# Patient Record
Sex: Female | Born: 1970 | Race: White | Hispanic: No | Marital: Married | Smoking: Never smoker
Health system: Southern US, Community
[De-identification: ages and names within clinical notes are randomized; demographics above are authoritative.]

## PROBLEM LIST (undated history)

## (undated) DIAGNOSIS — J45909 Unspecified asthma, uncomplicated: Secondary | ICD-10-CM

---

## 2014-01-21 ENCOUNTER — Emergency Department (HOSPITAL_BASED_OUTPATIENT_CLINIC_OR_DEPARTMENT_OTHER)
Admission: EM | Admit: 2014-01-21 | Discharge: 2014-01-21 | Disposition: A | Discharge status: Home / Self Care | Payer: 59 | Attending: Emergency Medicine | Admitting: Emergency Medicine

## 2014-01-21 ENCOUNTER — Other Ambulatory Visit: Payer: Self-pay

## 2014-01-21 ENCOUNTER — Emergency Department (HOSPITAL_BASED_OUTPATIENT_CLINIC_OR_DEPARTMENT_OTHER): Payer: 59

## 2014-01-21 ENCOUNTER — Encounter (HOSPITAL_BASED_OUTPATIENT_CLINIC_OR_DEPARTMENT_OTHER): Payer: Self-pay | Admitting: Emergency Medicine

## 2014-01-21 DIAGNOSIS — Z79899 Other long term (current) drug therapy: Secondary | ICD-10-CM | POA: Insufficient documentation

## 2014-01-21 DIAGNOSIS — J45901 Unspecified asthma with (acute) exacerbation: Secondary | ICD-10-CM | POA: Insufficient documentation

## 2014-01-21 DIAGNOSIS — F411 Generalized anxiety disorder: Secondary | ICD-10-CM | POA: Insufficient documentation

## 2014-01-21 DIAGNOSIS — F419 Anxiety disorder, unspecified: Secondary | ICD-10-CM

## 2014-01-21 DIAGNOSIS — R0600 Dyspnea, unspecified: Secondary | ICD-10-CM

## 2014-01-21 HISTORY — DX: Unspecified asthma, uncomplicated: J45.909

## 2014-01-21 LAB — CBC WITH DIFFERENTIAL/PLATELET
BASOS ABS: 0 10*3/uL (ref 0.0–0.1)
Basophils Relative: 1 % (ref 0–1)
Eosinophils Absolute: 0.1 10*3/uL (ref 0.0–0.7)
Eosinophils Relative: 1 % (ref 0–5)
HEMATOCRIT: 38.7 % (ref 36.0–46.0)
Hemoglobin: 12.8 g/dL (ref 12.0–15.0)
LYMPHS PCT: 34 % (ref 12–46)
Lymphs Abs: 2.5 10*3/uL (ref 0.7–4.0)
MCH: 26.3 pg (ref 26.0–34.0)
MCHC: 33.1 g/dL (ref 30.0–36.0)
MCV: 79.5 fL (ref 78.0–100.0)
MONO ABS: 0.4 10*3/uL (ref 0.1–1.0)
Monocytes Relative: 6 % (ref 3–12)
NEUTROS ABS: 4.3 10*3/uL (ref 1.7–7.7)
Neutrophils Relative %: 59 % (ref 43–77)
Platelets: 550 10*3/uL — ABNORMAL HIGH (ref 150–400)
RBC: 4.87 MIL/uL (ref 3.87–5.11)
RDW: 13.9 % (ref 11.5–15.5)
WBC: 7.4 10*3/uL (ref 4.0–10.5)

## 2014-01-21 LAB — COMPREHENSIVE METABOLIC PANEL
ALT: 17 U/L (ref 0–35)
AST: 16 U/L (ref 0–37)
Albumin: 4.1 g/dL (ref 3.5–5.2)
Alkaline Phosphatase: 75 U/L (ref 39–117)
BUN: 11 mg/dL (ref 6–23)
CHLORIDE: 105 meq/L (ref 96–112)
CO2: 21 mEq/L (ref 19–32)
Calcium: 9.7 mg/dL (ref 8.4–10.5)
Creatinine, Ser: 0.7 mg/dL (ref 0.50–1.10)
GFR calc Af Amer: >90 mL/min (ref >90)
GFR calc non Af Amer: >90 mL/min (ref >90)
Glucose, Bld: 106 mg/dL — ABNORMAL HIGH (ref 70–99)
Potassium: 3.7 mEq/L (ref 3.7–5.3)
Sodium: 142 mEq/L (ref 137–147)
Total Bilirubin: <0.2 mg/dL — ABNORMAL LOW (ref 0.3–1.2)
Total Protein: 7.5 g/dL (ref 6.0–8.3)

## 2014-01-21 LAB — TROPONIN I: Troponin I: <0.3 ng/mL (ref <0.30)

## 2014-01-21 LAB — D-DIMER, QUANTITATIVE: D-Dimer, Quant: <0.27 ug/mL-FEU (ref 0.00–0.48)

## 2014-01-21 MED ORDER — ALBUTEROL SULFATE (2.5 MG/3ML) 0.083% IN NEBU
5.0000 mg | INHALATION_SOLUTION | Freq: Once | RESPIRATORY_TRACT | Status: AC
  Administered 2014-01-21: 5 mg via RESPIRATORY_TRACT
  Filled 2014-01-21: qty 6

## 2014-01-21 MED ORDER — ALBUTEROL SULFATE HFA 108 (90 BASE) MCG/ACT IN AERS: 1.0000 | INHALATION_SPRAY | Freq: Four times a day (QID) | RESPIRATORY_TRACT | Status: DC | PRN

## 2014-01-21 NOTE — ED Notes (Signed)
Pt to room 3 by ems via stretcher. Pt reports sudden onset of sob while sitting at her desk at work, pt reports ongoing "tickling, dry" cough also. Pt states she did not have the cough before the sudden onset of sob.

## 2014-01-21 NOTE — ED Provider Notes (Signed)
CSN: [id number]     Arrival date & time 01/21/14  1208 History   First MD Initiated Contact with Patient 01/21/14 1208     Chief Complaint  Patient presents with  . Shortness of Breath     (Consider location/radiation/quality/duration/timing/severity/associated sxs/prior Treatment) HPI Comments: Patient presents with sudden onset of shortness of breath while she was sitting at her desk talking on the phone. Associated with dry cough. She denies any chest pain, fever or chills. She's had cold symptoms for the past several days but thought she was getting better. No fever. He does not smoke. She states she has a history of asthma but only uses an inhaler occasionally when she has allergies. She is not taking medications everyday. No leg pain or leg swelling. no birth control use. No recent travel. History of anxiety attacks remotely.  The history is provided by the patient.    Past Medical History  Diagnosis Date  . Asthma    History reviewed. No pertinent past surgical history. History reviewed. No pertinent family history. History  Substance Use Topics  . Smoking status: Never Smoker   . Smokeless tobacco: Not on file  . Alcohol Use: Not on file   OB History   Grav Para Term Preterm Abortions TAB SAB Ect Mult Living                 Review of Systems  Constitutional: Positive for activity change. Negative for appetite change.  HENT: Negative for congestion and rhinorrhea.   Respiratory: Positive for cough and shortness of breath. Negative for chest tightness.   Cardiovascular: Negative for chest pain.  Gastrointestinal: Negative for nausea, vomiting and abdominal pain.  Genitourinary: Negative for dysuria and hematuria.  Musculoskeletal: Negative for arthralgias, back pain and myalgias.  Skin: Negative for rash.  Neurological: Negative for dizziness, weakness and headaches.  A complete 10 system review of systems was obtained and all systems are negative except as noted in the  HPI and PMH.      Allergies  Review of patient's allergies indicates no known allergies.  Home Medications   Current Outpatient Rx  Name  Route  Sig  Dispense  Refill  . albuterol (PROVENTIL HFA;VENTOLIN HFA) 108 (90 BASE) MCG/ACT inhaler   Inhalation   Inhale 1-2 puffs into the lungs every 6 (six) hours as needed for wheezing or shortness of breath.   1 Inhaler   0    BP 133/78  Pulse 100  Resp 16  SpO2 100%  LMP 01/14/2014 Physical Exam  Constitutional: She is oriented to person, place, and time. She appears well-developed and well-nourished. No distress.  anxious  HENT:  Head: Normocephalic and atraumatic.  Mouth/Throat: Oropharynx is clear and moist. No oropharyngeal exudate.  Eyes: Conjunctivae and EOM are normal. Pupils are equal, round, and reactive to light.  Neck: Normal range of motion. Neck supple.  Cardiovascular: Normal rate, regular rhythm and normal heart sounds.   No murmur heard. Pulmonary/Chest: Effort normal and breath sounds normal. No respiratory distress. She has no wheezes.  Abdominal: Soft. There is no tenderness. There is no rebound and no guarding.  Musculoskeletal: Normal range of motion. She exhibits no edema and no tenderness.  Neurological: She is alert and oriented to person, place, and time. No cranial nerve deficit. She exhibits normal muscle tone. Coordination normal.  Skin: Skin is warm.    ED Course  Procedures (including critical care time) Labs Review Labs Reviewed  CBC WITH DIFFERENTIAL - Abnormal; Notable for  the following:    Platelets 550 (*)    All other components within normal limits  COMPREHENSIVE METABOLIC PANEL - Abnormal; Notable for the following:    Glucose, Bld 106 (*)    Total Bilirubin <0.2 (*)    All other components within normal limits  TROPONIN I  D-DIMER, QUANTITATIVE   Imaging Review Dg Chest 2 View  01/21/2014   CLINICAL DATA:  Shortness of breath.  EXAM: CHEST  2 VIEW  COMPARISON:  None.  FINDINGS:  The heart size and mediastinal contours are within normal limits. Both lungs are clear. The visualized skeletal structures are unremarkable.  IMPRESSION: No active cardiopulmonary disease.   Electronically Signed   By: Roque LiasJames  Green M.D.   On: 01/21/2014 13:24     EKG Interpretation None      MDM   Final diagnoses:  Dyspnea  Anxiety    Sudden onset of shortness of breath while sitting at work. Associated with dry cough. No chest pain. Patient is in no distress. Lungs are clear. Oxygenation is normal. EKG normal sinus rhythm.  Check chest x-ray with labs and d-dimer.  EKG normal sinus rhythm. D-dimer negative. Troponin negative.  Patient improved on recheck. X-ray negative. No wheezing on exam.  Suspect acute dyspnea likely secondary to anxiety with possibly some component of bronchospasm. No evidence of MI, PE, pneumonia pneumothorax. 02 saturation 100%. No wheezing. Patient feels better. We'll discharge with albuterol inhaler and PCP followup.   Date: 01/21/2014  Rate: 70  Rhythm: normal sinus rhythm  QRS Axis: normal  Intervals: normal  ST/T Wave abnormalities: normal  Conduction Disutrbances:none  Narrative Interpretation:   Old EKG Reviewed: none available  BP 133/78  Pulse 100  Resp 16  SpO2 100%  LMP 01/14/2014   Glynn OctaveStephen Elliette Seabolt, MD 01/21/14 1446

## 2014-01-21 NOTE — Discharge Instructions (Signed)
Shortness of Breath There is no evidence of heart attack or blood clot in the lung. Follow up with your doctor. Return to the ED if you develop new or worsening symptoms. Shortness of breath means you have trouble breathing. Shortness of breath may indicate that you have a medical problem. You should seek immediate medical care for shortness of breath. CAUSES   Not enough oxygen in the air (as with high altitudes or a smoke-filled room).  Short-term (acute) lung disease, including:  Infections, such as pneumonia.  Fluid in the lungs, such as heart failure.  A blood clot in the lungs (pulmonary embolism).  Long-term (chronic) lung diseases.  Heart disease (heart attack, angina, heart failure, and others).  Low red blood cells (anemia).  Poor physical fitness. This can cause shortness of breath when you exercise.  Chest or back injuries or stiffness.  Being overweight.  Smoking.  Anxiety. This can make you feel like you are not getting enough air. DIAGNOSIS  Serious medical problems can usually be found during your physical exam. Tests may also be done to determine why you are having shortness of breath. Tests may include:  Chest X-rays.  Lung function tests.  Blood tests.  Electrocardiography.  Exercise testing.  Echocardiography.  Imaging scans. Your caregiver may not be able to find a cause for your shortness of breath after your exam. In this case, it is important to have a follow-up exam with your caregiver as directed.  TREATMENT  Treatment for shortness of breath depends on the cause of your symptoms and can vary greatly. HOME CARE INSTRUCTIONS   Do not smoke. Smoking is a common cause of shortness of breath. If you smoke, ask for help to quit.  Avoid being around chemicals or things that may bother your breathing, such as paint fumes and dust.  Rest as needed. Slowly resume your usual activities.  If medicines were prescribed, take them as directed for  the full length of time directed. This includes oxygen and any inhaled medicines.  Keep all follow-up appointments as directed by your caregiver. SEEK MEDICAL CARE IF:   Your condition does not improve in the time expected.  You have a hard time doing your normal activities even with rest.  You have any side effects or problems with the medicines prescribed.  You develop any new symptoms. SEEK IMMEDIATE MEDICAL CARE IF:   Your shortness of breath gets worse.  You feel lightheaded, faint, or develop a cough not controlled with medicines.  You start coughing up blood.  You have pain with breathing.  You have chest pain or pain in your arms, shoulders, or abdomen.  You have a fever.  You are unable to walk up stairs or exercise the way you normally do. MAKE SURE YOU:  Understand these instructions.  Will watch your condition.  Will get help right away if you are not doing well or get worse. Document Released: 08/03/2001 Document Revised: 05/09/2012 Document Reviewed: 01/24/2012 [facility] Patient Information 2014 [employer] [place].

## 2017-10-26 ENCOUNTER — Emergency Department
Admission: EM | Admit: 2017-10-26 | Discharge: 2017-10-26 | Disposition: A | Discharge status: Home / Self Care | Payer: BLUE CROSS/BLUE SHIELD | Attending: Emergency Medicine | Admitting: Emergency Medicine

## 2017-10-26 ENCOUNTER — Other Ambulatory Visit: Payer: Self-pay

## 2017-10-26 ENCOUNTER — Emergency Department: Payer: BLUE CROSS/BLUE SHIELD

## 2017-10-26 ENCOUNTER — Encounter: Payer: Self-pay | Admitting: Emergency Medicine

## 2017-10-26 DIAGNOSIS — R531 Weakness: Secondary | ICD-10-CM | POA: Insufficient documentation

## 2017-10-26 DIAGNOSIS — J45909 Unspecified asthma, uncomplicated: Secondary | ICD-10-CM | POA: Insufficient documentation

## 2017-10-26 DIAGNOSIS — R5383 Other fatigue: Secondary | ICD-10-CM | POA: Diagnosis present

## 2017-10-26 LAB — POCT PREGNANCY, URINE: PREG TEST UR: NEGATIVE

## 2017-10-26 LAB — CBC
HEMATOCRIT: 37.9 % (ref 35.0–47.0)
HEMOGLOBIN: 12.3 g/dL (ref 12.0–16.0)
MCH: 25.1 pg — ABNORMAL LOW (ref 26.0–34.0)
MCHC: 32.5 g/dL (ref 32.0–36.0)
MCV: 77.3 fL — AB (ref 80.0–100.0)
Platelets: 497 10*3/uL — ABNORMAL HIGH (ref 150–440)
RBC: 4.9 MIL/uL (ref 3.80–5.20)
RDW: 17 % — AB (ref 11.5–14.5)
WBC: 7.8 10*3/uL (ref 3.6–11.0)

## 2017-10-26 LAB — BASIC METABOLIC PANEL
ANION GAP: 6 (ref 5–15)
BUN: 10 mg/dL (ref 6–20)
CO2: 22 mmol/L (ref 22–32)
Calcium: 9.9 mg/dL (ref 8.9–10.3)
Chloride: 104 mmol/L (ref 101–111)
Creatinine, Ser: 0.84 mg/dL (ref 0.44–1.00)
GFR calc Af Amer: >60 mL/min (ref >60)
GFR calc non Af Amer: >60 mL/min (ref >60)
GLUCOSE: 132 mg/dL — AB (ref 65–99)
POTASSIUM: 4 mmol/L (ref 3.5–5.1)
Sodium: 132 mmol/L — ABNORMAL LOW (ref 135–145)

## 2017-10-26 LAB — URINALYSIS, COMPLETE (UACMP) WITH MICROSCOPIC
BACTERIA UA: NONE SEEN
Bilirubin Urine: NEGATIVE
Glucose, UA: NEGATIVE mg/dL
HGB URINE DIPSTICK: NEGATIVE
Ketones, ur: NEGATIVE mg/dL
Leukocytes, UA: NEGATIVE
Nitrite: NEGATIVE
Protein, ur: NEGATIVE mg/dL
SPECIFIC GRAVITY, URINE: 1.004 — AB (ref 1.005–1.030)
pH: 5 (ref 5.0–8.0)

## 2017-10-26 LAB — TROPONIN I: Troponin I: <0.03 ng/mL (ref <0.03)

## 2017-10-26 MED ORDER — SODIUM CHLORIDE 0.9 % IV BOLUS (SEPSIS)
1000.0000 mL | Freq: Once | INTRAVENOUS | Status: AC
  Administered 2017-10-26: 1000 mL via INTRAVENOUS

## 2017-10-26 NOTE — Discharge Instructions (Signed)
Please seek medical attention for any high fevers, chest pain, shortness of breath, change in behavior, persistent vomiting, bloody stool or any other new or concerning symptoms.  

## 2017-10-26 NOTE — ED Provider Notes (Signed)
[facility] Emergency Department Provider Note   ____________________________________________   I have reviewed the triage vital signs and the nursing notes.   HISTORY  Chief Complaint Fatigue  History limited by: Not Limited   HPI Kelly Mcknight is a 46 y.o. female who presents to the emergency department today because of concern for fatigue.  DURATION:roughly one week TIMING: constant SEVERITY: severe QUALITY: fatigue CONTEXT: patient states that just prior to the fatigue starting she was evaluated at an urgent care and diagnosed with a UTI. She was started on an antibiotic. Fatigue started shortly after. She initially thought it might be due to the antibiotic. States she does snore at night.  MODIFYING FACTORS: none identified ASSOCIATED SYMPTOMS: denies any fever  Per medical record review patient has a history of asthma.   Past Medical History:  Diagnosis Date  . Asthma     There are no active problems to display for this patient.   History reviewed. No pertinent surgical history.  Prior to Admission medications   Medication Sig Start Date End Date Taking? Authorizing Provider  albuterol (PROVENTIL HFA;VENTOLIN HFA) 108 (90 BASE) MCG/ACT inhaler Inhale 1-2 puffs into the lungs every 6 (six) hours as needed for wheezing or shortness of breath. 01/21/14   Glynn Octaveancour, Stephen, MD    Allergies Patient has no known allergies.  No family history on file.  Social History Social History   Tobacco Use  . Smoking status: Never Smoker  . Smokeless tobacco: Never Used  Substance Use Topics  . Alcohol use: No    Frequency: Never  . Drug use: No    Review of Systems Constitutional: No fever/chills. Positive for fatigue.  Eyes: No visual changes. ENT: No sore throat. Cardiovascular: Denies chest pain. Respiratory: Denies shortness of breath. Gastrointestinal: No abdominal pain.  No nausea, no vomiting.  No diarrhea.   Genitourinary:  Negative for dysuria. Musculoskeletal: Negative for back pain. Skin: Negative for rash. Neurological: Negative for headaches, focal weakness or numbness.  ____________________________________________   PHYSICAL EXAM:  VITAL SIGNS: ED Triage Vitals  Enc Vitals Group     BP 10/26/17 0824 130/65     Pulse Rate 10/26/17 0824 85     Resp 10/26/17 0824 16     Temp 10/26/17 0824 98.2 F (36.8 C)     Temp Source 10/26/17 0824 Oral     SpO2 10/26/17 0824 97 %     Weight 10/26/17 0826 230 lb (104.3 kg)     Height 10/26/17 0826 5\' 7"  (1.702 m)     Head Circumference --      Peak Flow --      Pain Score 10/26/17 0826 0   Constitutional: Alert and oriented. Well appearing and in no distress. Eyes: Conjunctivae are normal.  ENT   Head: Normocephalic and atraumatic.   Nose: No congestion/rhinnorhea.   Mouth/Throat: Mucous membranes are moist.   Neck: No stridor. Hematological/Lymphatic/Immunilogical: No cervical lymphadenopathy. Cardiovascular: Normal rate, regular rhythm.  No murmurs, rubs, or gallops.  Respiratory: Normal respiratory effort without tachypnea nor retractions. Breath sounds are clear and equal bilaterally. No wheezes/rales/rhonchi. Gastrointestinal: Soft and non tender. No rebound. No guarding.  Genitourinary: Deferred Musculoskeletal: Normal range of motion in all extremities. No lower extremity edema. Neurologic:  Normal speech and language. No gross focal neurologic deficits are appreciated.  Skin:  Skin is warm, dry and intact. No rash noted. Psychiatric: Mood and affect are normal. Speech and behavior are normal. Patient exhibits appropriate insight and judgment.  ____________________________________________    LABS (pertinent positives/negatives)  Trop <0.03 BMP na 132, glu 132, k 4.0 CBC wbc 7.8, hgb 12.3, plt 497  ____________________________________________   EKG  I, Phineas SemenGraydon Lasharon Dunivan, attending physician, personally viewed and interpreted  this EKG  EKG Time: 0831 Rate: 80 Rhythm: normal sinus rhythm Axis: normal Intervals: qtc 433 QRS: narrow ST changes: no st elevation Impression: normal ekg  ____________________________________________    RADIOLOGY  CXR No acute disease  ____________________________________________   PROCEDURES  Procedures  ____________________________________________   INITIAL IMPRESSION / ASSESSMENT AND PLAN / ED COURSE  Pertinent labs & imaging results that were available during my care of the patient were reviewed by me and considered in my medical decision making (see chart for details).  Patient presented to the emergency department today with primary concern for fatigue. Concern for dehydration, infection, OSA, anemia amongst other etiologies. Blood work here without any clear etiology of the patient's symptoms. Patient stated she did feel better after IVFs. Discussed results with patient. Will discharge with PCP information.  ____________________________________________   FINAL CLINICAL IMPRESSION(S) / ED DIAGNOSES  Final diagnoses:  Weakness     Note: This dictation was prepared with Dragon dictation. Any transcriptional errors that result from this process are unintentional     Phineas SemenGoodman, Daxson Reffett, MD 10/26/17 1145

## 2017-10-26 NOTE — ED Notes (Signed)
Dr. Goodman at bedside.  

## 2017-10-26 NOTE — ED Notes (Signed)
Pt states she was recently treated for UTI, has been dizzy for 2 weeks. She has been taking an antibiotic but did not take a dose yet today. Pt also states she had intermittent episodes of chest pain.first on the left side near the shoulder and then again on the right side. Pt denies any pain at this time.

## 2017-10-26 NOTE — ED Triage Notes (Signed)
Pt to ED via POV with c/o dizziness and fatigue x2 weeks, states recently on anitbiotic for UTI and since has had fatigue. Pt states she also had CP that started this am but has resolved. Pt ambulatory, VS stable

## 2018-09-03 IMAGING — CR DG CHEST 2V
1 series · 2 of 2 positions shown · non-contrast
Comparison: PA and lateral chest x-ray January 21, 2014

CLINICAL DATA: Two weeks of dizziness and fatigue. Recently treated
for urinary tract infection with antibiotics. Fatigue since then.
Patient reported an episode of chest pain this morning which has
resolved.

EXAM:
CHEST  2 VIEW

[Series 1: dg chest 2 view · 0.14mm/px · 2 of 2 slices shown]
[im 1/2]
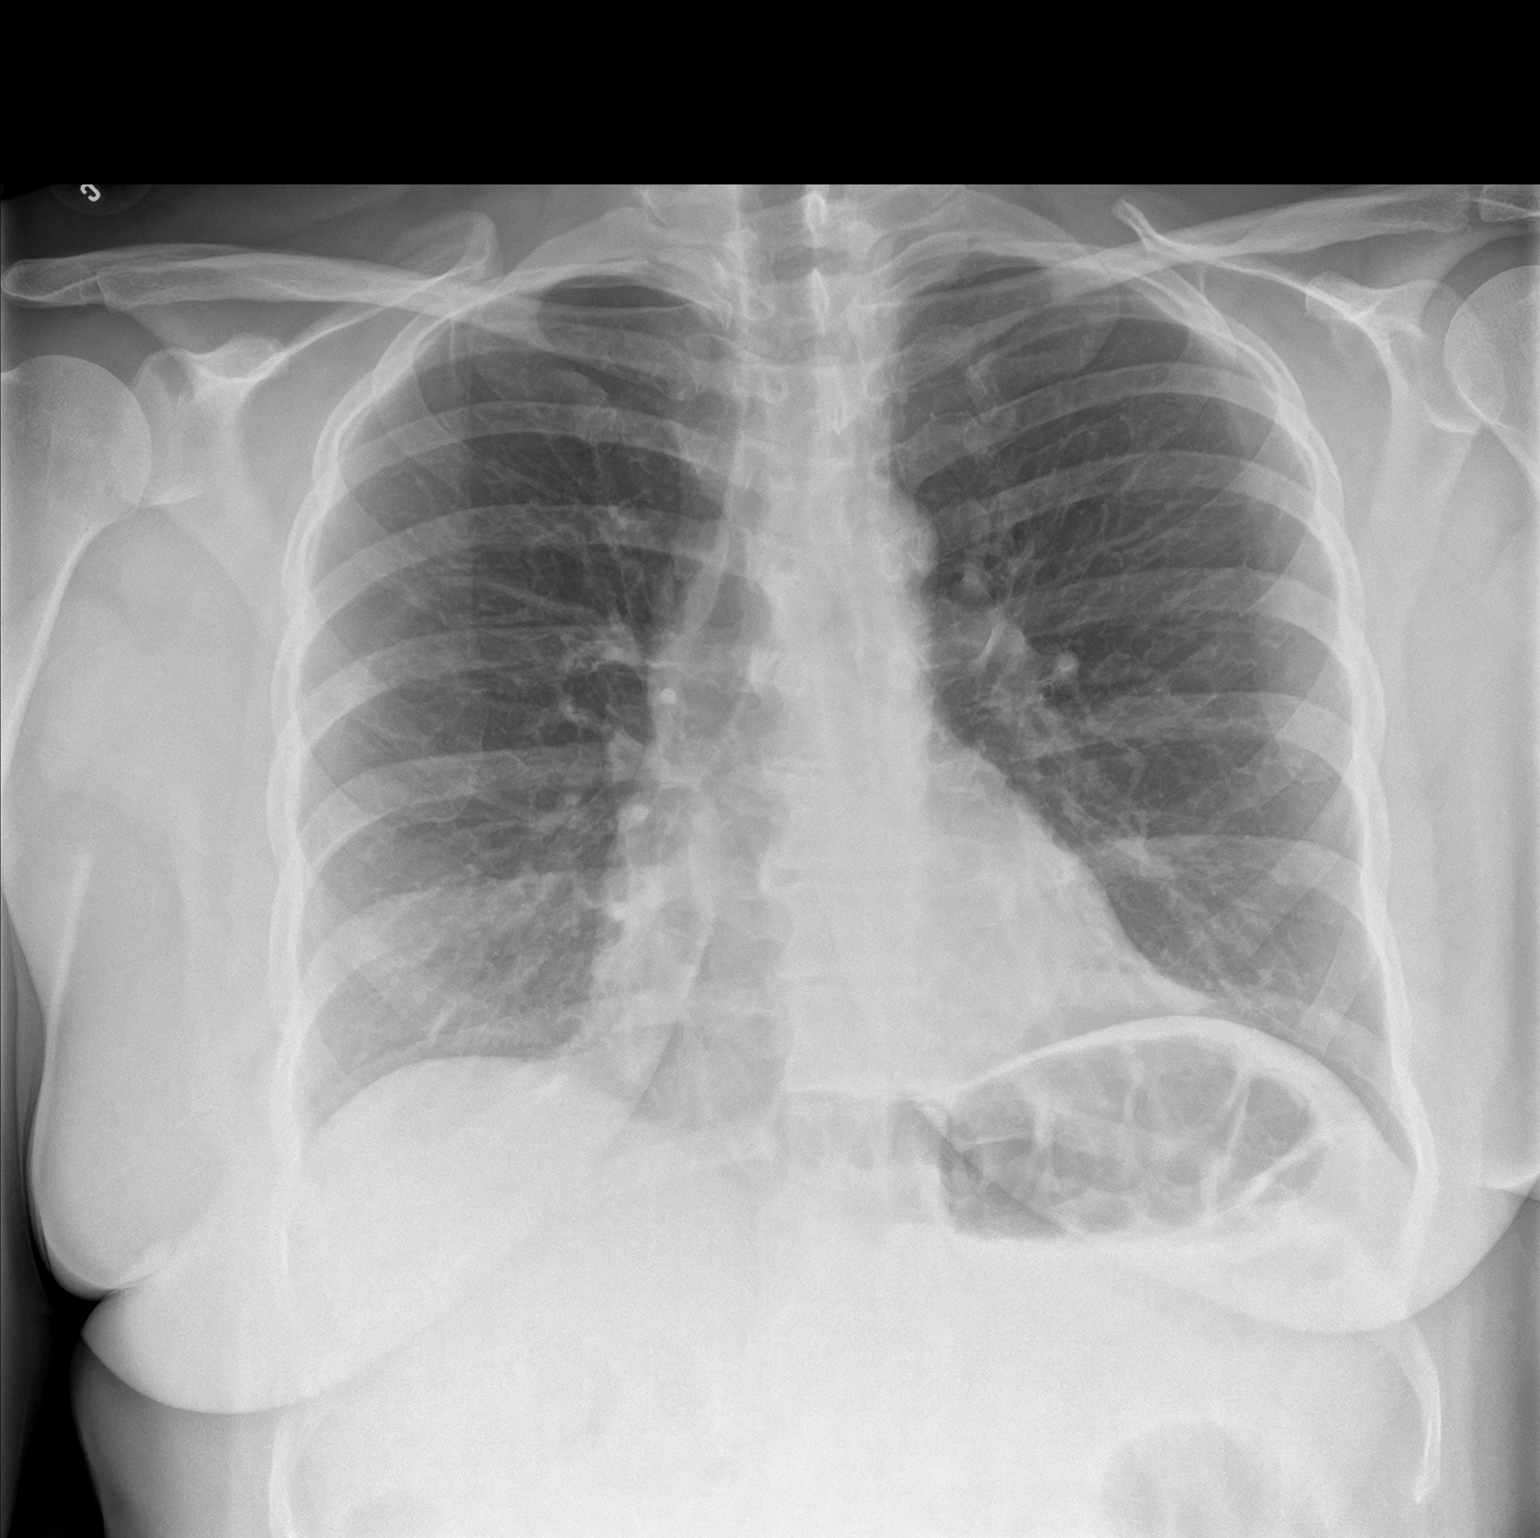
[im 2/2]
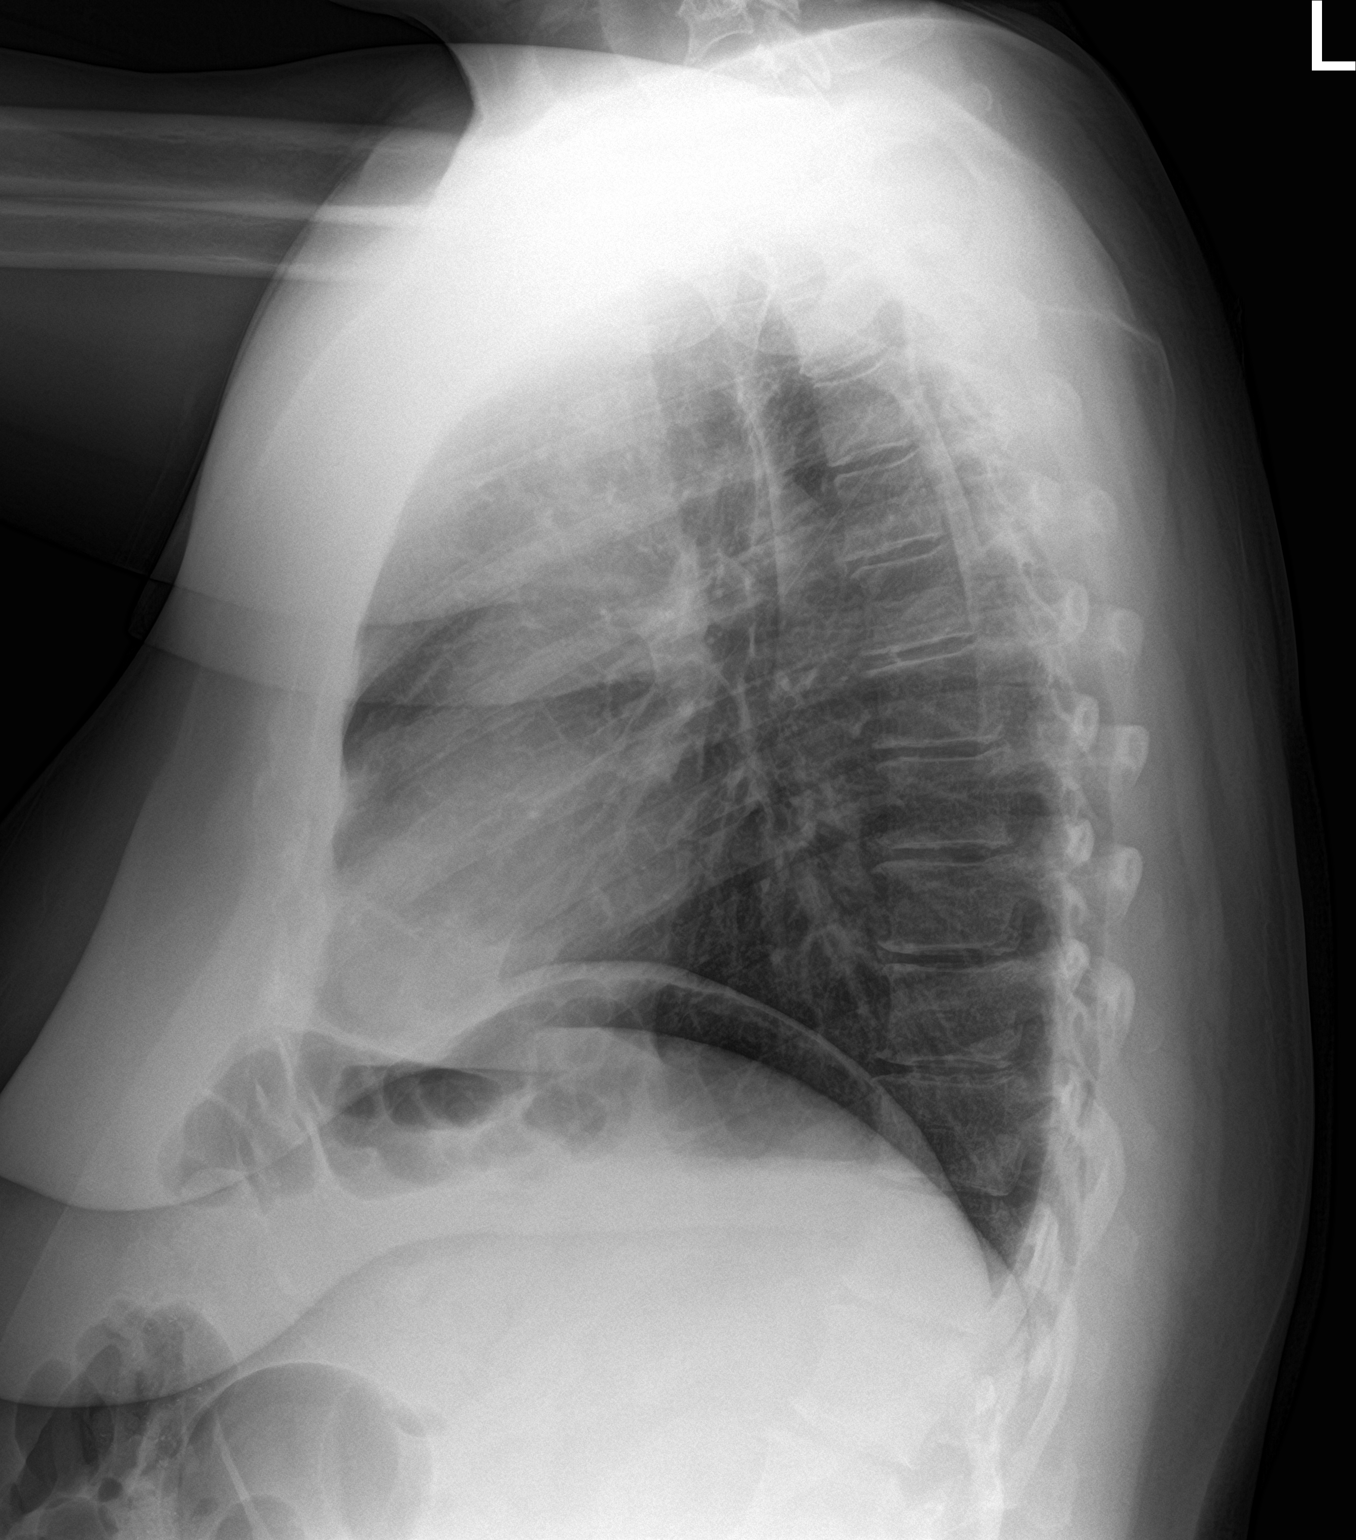

[2 of 2 positions shown; findings below may reference images not displayed]

FINDINGS: The lungs are well-expanded. There is no focal infiltrate. The
interstitial markings are coarse though stable. The heart and
pulmonary vascularity are normal. The mediastinum is normal in
width. There is no pleural effusion. The bony thorax exhibits no
acute abnormality.
IMPRESSION: There is no pneumonia nor other acute cardiopulmonary abnormality.
